# Patient Record
Sex: Male | Born: 1990 | Race: White | Hispanic: No | Marital: Single | Smoking: Current some day smoker
Health system: Southern US, Community
[De-identification: ages and names within clinical notes are randomized; demographics above are authoritative.]

---

## 2016-05-16 ENCOUNTER — Encounter (HOSPITAL_COMMUNITY): Payer: Self-pay | Admitting: Emergency Medicine

## 2016-05-16 ENCOUNTER — Emergency Department (HOSPITAL_COMMUNITY)
Admission: EM | Admit: 2016-05-16 | Discharge: 2016-05-16 | Disposition: A | Discharge status: Home / Self Care | Payer: Self-pay | Attending: Emergency Medicine | Admitting: Emergency Medicine

## 2016-05-16 ENCOUNTER — Emergency Department (HOSPITAL_COMMUNITY): Payer: Self-pay

## 2016-05-16 DIAGNOSIS — L259 Unspecified contact dermatitis, unspecified cause: Secondary | ICD-10-CM | POA: Insufficient documentation

## 2016-05-16 DIAGNOSIS — F17200 Nicotine dependence, unspecified, uncomplicated: Secondary | ICD-10-CM | POA: Insufficient documentation

## 2016-05-16 LAB — CBC WITH DIFFERENTIAL/PLATELET
BASOS ABS: 0 10*3/uL (ref 0.0–0.1)
BASOS PCT: 0 %
EOS ABS: 0 10*3/uL (ref 0.0–0.7)
Eosinophils Relative: 1 %
HEMATOCRIT: 42.9 % (ref 39.0–52.0)
HEMOGLOBIN: 14.7 g/dL (ref 13.0–17.0)
Lymphocytes Relative: 13 %
Lymphs Abs: 1 10*3/uL (ref 0.7–4.0)
MCH: 32.7 pg (ref 26.0–34.0)
MCHC: 34.3 g/dL (ref 30.0–36.0)
MCV: 95.3 fL (ref 78.0–100.0)
MONOS PCT: 7 %
Monocytes Absolute: 0.6 10*3/uL (ref 0.1–1.0)
NEUTROS ABS: 6.3 10*3/uL (ref 1.7–7.7)
NEUTROS PCT: 79 %
Platelets: 128 10*3/uL — ABNORMAL LOW (ref 150–400)
RBC: 4.5 MIL/uL (ref 4.22–5.81)
RDW: 13 % (ref 11.5–15.5)
WBC: 7.9 10*3/uL (ref 4.0–10.5)

## 2016-05-16 LAB — COMPREHENSIVE METABOLIC PANEL
ALBUMIN: 4.2 g/dL (ref 3.5–5.0)
ALK PHOS: 54 U/L (ref 38–126)
ALT: 41 U/L (ref 17–63)
AST: 55 U/L — AB (ref 15–41)
Anion gap: 11 (ref 5–15)
BILIRUBIN TOTAL: 1 mg/dL (ref 0.3–1.2)
BUN: 12 mg/dL (ref 6–20)
CALCIUM: 9.1 mg/dL (ref 8.9–10.3)
CO2: 21 mmol/L — AB (ref 22–32)
Chloride: 104 mmol/L (ref 101–111)
Creatinine, Ser: 1.02 mg/dL (ref 0.61–1.24)
GFR calc Af Amer: >60 mL/min (ref >60)
GFR calc non Af Amer: >60 mL/min (ref >60)
GLUCOSE: 64 mg/dL — AB (ref 65–99)
Potassium: 3.8 mmol/L (ref 3.5–5.1)
SODIUM: 136 mmol/L (ref 135–145)
TOTAL PROTEIN: 6.9 g/dL (ref 6.5–8.1)

## 2016-05-16 LAB — I-STAT TROPONIN, ED: Troponin i, poc: 0 ng/mL (ref 0.00–0.08)

## 2016-05-16 LAB — URINALYSIS, ROUTINE W REFLEX MICROSCOPIC
Bilirubin Urine: NEGATIVE
GLUCOSE, UA: NEGATIVE mg/dL
HGB URINE DIPSTICK: NEGATIVE
Ketones, ur: 15 mg/dL — AB
LEUKOCYTES UA: NEGATIVE
Nitrite: NEGATIVE
PROTEIN: NEGATIVE mg/dL
SPECIFIC GRAVITY, URINE: 1.011 (ref 1.005–1.030)
pH: 5 (ref 5.0–8.0)

## 2016-05-16 LAB — D-DIMER, QUANTITATIVE (NOT AT ARMC)

## 2016-05-16 MED ORDER — METHYLPREDNISOLONE SODIUM SUCC 125 MG IJ SOLR
125.0000 mg | Freq: Once | INTRAMUSCULAR | Status: AC
  Administered 2016-05-16: 125 mg via INTRAVENOUS
  Filled 2016-05-16: qty 2

## 2016-05-16 MED ORDER — PREDNISONE 10 MG PO TABS
40.0000 mg | ORAL_TABLET | Freq: Every day | ORAL | 0 refills | Status: AC
Start: 2016-05-16 — End: 2016-05-20

## 2016-05-16 MED ORDER — DIPHENHYDRAMINE HCL 50 MG/ML IJ SOLN
50.0000 mg | Freq: Once | INTRAMUSCULAR | Status: AC
  Administered 2016-05-16: 50 mg via INTRAVENOUS
  Filled 2016-05-16: qty 1

## 2016-05-16 MED ORDER — FAMOTIDINE IN NACL 20-0.9 MG/50ML-% IV SOLN
20.0000 mg | Freq: Once | INTRAVENOUS | Status: AC
  Administered 2016-05-16: 20 mg via INTRAVENOUS
  Filled 2016-05-16: qty 50

## 2016-05-16 MED ORDER — SODIUM CHLORIDE 0.9 % IV BOLUS (SEPSIS)
1000.0000 mL | Freq: Once | INTRAVENOUS | Status: AC
  Administered 2016-05-16: 1000 mL via INTRAVENOUS

## 2016-05-16 NOTE — ED Provider Notes (Signed)
[facility] DEPT Provider Note   CSN: [id number] Arrival date & time: 05/16/16  1547     History   Chief Complaint Chief Complaint  Patient presents with  . Allergic Reaction    HPI Andrew Mckinney is a 25 y.o. male.  HPI Patient is a 25 year old male no pertinent past medical history comes in today complaining of a rash.  Patient states that he has had symptoms like this previously has been going on for approximately 3 years however he states that this is the worst episode he has had.  Patient states he was at work when he suddenly started to feel that his heart was going to get out of his chest shortly thereafter patient erupted in a burning pruritic macular rash.  Patient states it is spread since that time and is now covering majority of his face shoulders becoming more  sparse inferiorly with fewer lesions on the abdomen.  Patient denies general or anal involvement.  Patient also states that he has the rash on his upper extremities as well sparing the palms of the hands.  Patient denies any rash in the lower extremities.  Patient denies fevers chills nausea vomiting.  Patient denies chest pain shortness of breath.  Physical exam: Rash described above.  Rashes diffusely erythematous no noted central clearing.  Rash covers patient's face neck shoulders chest back abdomen and bilateral upper extremities.  Patient clear to auscultation bilaterally.  Patient speaking in full sentences without shortness of breath.  Believe patient likely to be suffering from contact dermatitis.  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  Patient states that he has had exposure to Surveyor, quantity due to his job.  Will give steroids, Benadryl and we will additionally collect troponin and chest x-ray.  Pts workup largely WNL. Pt's rash improved w/ benadryl and steroids. Will discharge w/ prednisone.   Pt given appropriate f/u and return precautions. Pt voiced  understanding and is agreeable to discharge at this time.    History reviewed. No pertinent past medical history.  There are no active problems to display for this patient.   History reviewed. No pertinent surgical history.     Home Medications    Prior to Admission medications   Medication Sig Start Date End Date Taking? Authorizing Provider  diphenhydrAMINE (BENADRYL) 25 MG tablet Take 25 mg by mouth every 6 (six) hours as needed for sleep.   Yes Historical Provider, MD  ibuprofen (ADVIL,MOTRIN) 200 MG tablet Take 400 mg by mouth every 6 (six) hours as needed for mild pain or moderate pain.   Yes Historical Provider, MD  ketoconazole (NIZORAL) 2 % cream Apply 1 application topically daily as needed for irritation.   Yes Historical Provider, MD  predniSONE (DELTASONE) 10 MG tablet Take 4 tablets (40 mg total) by mouth daily. 05/16/16 05/20/16  [name], MD    Family History No family history on file.  Social History Social History  Substance Use Topics  . Smoking status: Current Some Day Smoker  . Smokeless tobacco: Never Used  . Alcohol use Not on file     Allergies   Lamisil [terbinafine hcl]   Review of Systems Review of Systems  Constitutional: Negative for chills, fatigue and fever.  Respiratory: Negative for chest tightness and shortness of breath.   Cardiovascular: Negative for leg swelling.  Gastrointestinal: Negative for nausea and vomiting.  Endocrine: Positive for polydipsia and polyuria.  Musculoskeletal: Negative for neck pain.  All  other systems reviewed and are negative.    Physical Exam Updated Vital Signs BP 154/96   Pulse 87   Temp 98.2 F (36.8 C) (Oral)   Resp 18   SpO2 100%   Physical Exam  Constitutional: He appears well-developed and well-nourished.  HENT:  Head: Normocephalic and atraumatic.  Eyes: Conjunctivae are normal.  Neck: Neck supple.  Cardiovascular: Normal rate and regular rhythm.   No murmur  heard. Pulmonary/Chest: Effort normal and breath sounds normal. No respiratory distress.  Abdominal: Soft. There is no tenderness.  Musculoskeletal: He exhibits no edema.  Neurological: He is alert.  Skin: Skin is warm and dry. Rash noted.  Psychiatric: He has a normal mood and affect.  Nursing note and vitals reviewed.    ED Treatments / Results  Labs (all labs ordered are listed, but only abnormal results are displayed) Labs Reviewed  CBC WITH DIFFERENTIAL/PLATELET - Abnormal; Notable for the following:       Result Value   Platelets 128 (*)    All other components within normal limits  COMPREHENSIVE METABOLIC PANEL - Abnormal; Notable for the following:    CO2 21 (*)    Glucose, Bld 64 (*)    AST 55 (*)    All other components within normal limits  URINALYSIS, ROUTINE W REFLEX MICROSCOPIC (NOT AT [facility]) - Abnormal; Notable for the following:    Ketones, ur 15 (*)    All other components within normal limits  D-DIMER, QUANTITATIVE (NOT AT [facility])  I-STAT TROPOININ, ED    EKG  EKG Interpretation  Date/Time:  Friday May 16 2016 16:11:52 EDT Ventricular Rate:  124 PR Interval:    QRS Duration: 77 QT Interval:  290 QTC Calculation: 417 R Axis:   86 Text Interpretation:  Sinus tachycardia Probable left atrial enlargement Anterior infarct, old No previous ECGs available Confirmed by YAO  MD, DAVID ([id number]) on 05/16/2016 4:14:13 PM       Radiology Dg Chest 2 View  Result Date: 05/16/2016 CLINICAL DATA:  Chest pain EXAM: CHEST  2 VIEW COMPARISON:  None. FINDINGS: The heart size and mediastinal contours are within normal limits. Both lungs are clear. The visualized skeletal structures are unremarkable. IMPRESSION: No active cardiopulmonary disease. Electronically Signed   By: Charlett NoseKevin  Dover M.D.   On: 05/16/2016 17:21    Procedures Procedures (including critical care time)  Medications Ordered in ED Medications  sodium chloride 0.9 % bolus 1,000 mL (0 mLs Intravenous  Stopped 05/16/16 1740)  methylPREDNISolone sodium succinate (SOLU-MEDROL) 125 mg/2 mL injection 125 mg (125 mg Intravenous Given 05/16/16 1648)  diphenhydrAMINE (BENADRYL) injection 50 mg (50 mg Intravenous Given 05/16/16 1648)  famotidine (PEPCID) IVPB 20 mg premix (0 mg Intravenous Stopped 05/16/16 1722)     Initial Impression / Assessment and Plan / ED Course  I have reviewed the triage vital signs and the nursing notes.  Pertinent labs & imaging results that were available during my care of the patient were reviewed by me and considered in my medical decision making (see chart for details).  Clinical Course    Patient is a 25 year old male no pertinent past medical history comes in today complaining of a rash.  Patient states that he has had symptoms like this previously has been going on for approximately 3 years however he states that this is the worst episode he has had.  Patient states he was at work when he suddenly started to feel that his heart was going to get out of his  chest shortly thereafter patient erupted in a burning pruritic macular rash.  Patient states it is spread since that time and is now covering majority of his face shoulders becoming more  sparse inferiorly with fewer lesions on the abdomen.  Patient denies general or anal involvement.  Patient also states that he has the rash on his upper extremities as well sparing the palms of the hands.  Patient denies any rash in the lower extremities.  Patient denies fevers chills nausea vomiting.  Patient denies chest pain shortness of breath.  Physical exam: Rash described above.  Rashes diffusely erythematous no noted central clearing.  Rash covers patient's face neck shoulders chest back abdomen and bilateral upper extremities.  Patient clear to auscultation bilaterally.  Patient speaking in full sentences without shortness of breath.  Believe patient likely to be suffering from atopic dermatitis.  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  Patient states that he has had exposure to Surveyor, quantity due to his job.  Will give steroids, Benadryl and we will additionally collect troponin and chest x-ray.   Final Clinical Impressions(s) / ED Diagnoses   Final diagnoses:  Contact dermatitis    New Prescriptions Discharge Medication List as of 05/16/2016  9:06 PM    START taking these medications   Details  predniSONE (DELTASONE) 10 MG tablet Take 4 tablets (40 mg total) by mouth daily., Starting Fri 05/16/2016, Until Tue 05/20/2016, Print         [name], MD 05/16/16 4098    Charlynne Pander, MD 05/17/16 1415

## 2016-05-16 NOTE — ED Notes (Signed)
Patient states he doesn't feel like his rash is any better.  Has not been itching like he was

## 2016-05-16 NOTE — ED Triage Notes (Addendum)
Bad rash started at 8 am, burning and spreading , states crampy and is hoarse  Just getting off lamicil feels faint, feels sob  And bp is up , pt states rash has never been this high on chest before or face, face is flushed

## 2016-05-16 NOTE — ED Notes (Signed)
Rash noted all over body. Pt states this has been ongoing for several months, currently being treated with medications and topical creams at home. Reports pain and itching increased today, describes as burning sensation all over body. Respirations unlabored at present.

## 2016-05-16 NOTE — ED Notes (Signed)
Kathleen ArgueRyan Drewes [phone] (Brother)

## 2016-05-16 NOTE — ED Notes (Signed)
Discharge instructions and prescription reviewed - voiced understanding.  

## 2017-07-23 ENCOUNTER — Emergency Department (HOSPITAL_COMMUNITY): Payer: Self-pay

## 2017-07-23 ENCOUNTER — Other Ambulatory Visit: Payer: Self-pay

## 2017-07-23 ENCOUNTER — Emergency Department (HOSPITAL_COMMUNITY)
Admission: EM | Admit: 2017-07-23 | Discharge: 2017-07-23 | Payer: Self-pay | Attending: Emergency Medicine | Admitting: Emergency Medicine

## 2017-07-23 DIAGNOSIS — Y92023 Bedroom in mobile home as the place of occurrence of the external cause: Secondary | ICD-10-CM | POA: Insufficient documentation

## 2017-07-23 DIAGNOSIS — T23391A Burn of third degree of multiple sites of right wrist and hand, initial encounter: Secondary | ICD-10-CM | POA: Insufficient documentation

## 2017-07-23 DIAGNOSIS — Y9389 Activity, other specified: Secondary | ICD-10-CM | POA: Insufficient documentation

## 2017-07-23 DIAGNOSIS — Y999 Unspecified external cause status: Secondary | ICD-10-CM | POA: Insufficient documentation

## 2017-07-23 DIAGNOSIS — J705 Respiratory conditions due to smoke inhalation: Secondary | ICD-10-CM

## 2017-07-23 DIAGNOSIS — T23292A Burn of second degree of multiple sites of left wrist and hand, initial encounter: Secondary | ICD-10-CM | POA: Insufficient documentation

## 2017-07-23 DIAGNOSIS — T59811A Toxic effect of smoke, accidental (unintentional), initial encounter: Secondary | ICD-10-CM | POA: Insufficient documentation

## 2017-07-23 DIAGNOSIS — Z23 Encounter for immunization: Secondary | ICD-10-CM | POA: Insufficient documentation

## 2017-07-23 DIAGNOSIS — S6990XA Unspecified injury of unspecified wrist, hand and finger(s), initial encounter: Secondary | ICD-10-CM | POA: Insufficient documentation

## 2017-07-23 DIAGNOSIS — X000XXA Exposure to flames in uncontrolled fire in building or structure, initial encounter: Secondary | ICD-10-CM | POA: Insufficient documentation

## 2017-07-23 DIAGNOSIS — T23361A Burn of third degree of back of right hand, initial encounter: Secondary | ICD-10-CM

## 2017-07-23 LAB — BPAM FFP
BLOOD PRODUCT EXPIRATION DATE: 201811222359
BLOOD PRODUCT EXPIRATION DATE: 201811222359
ISSUE DATE / TIME: 201811220325
ISSUE DATE / TIME: 201811220325
UNIT TYPE AND RH: 6200
UNIT TYPE AND RH: 6200

## 2017-07-23 LAB — TYPE AND SCREEN
ABO/RH(D): A POS
Antibody Screen: NEGATIVE
UNIT DIVISION: 0
Unit division: 0

## 2017-07-23 LAB — PREPARE FRESH FROZEN PLASMA
UNIT DIVISION: 0
UNIT DIVISION: 0

## 2017-07-23 LAB — I-STAT ARTERIAL BLOOD GAS, ED
ACID-BASE DEFICIT: 4 mmol/L — AB (ref 0.0–2.0)
BICARBONATE: 21.1 mmol/L (ref 20.0–28.0)
O2 Saturation: 100 %
PH ART: 7.342 — AB (ref 7.350–7.450)
TCO2: 22 mmol/L (ref 22–32)
pCO2 arterial: 38.7 mmHg (ref 32.0–48.0)
pO2, Arterial: 255 mmHg — ABNORMAL HIGH (ref 83.0–108.0)

## 2017-07-23 LAB — COOXEMETRY PANEL
Carboxyhemoglobin: 7.7 % (ref 0.5–1.5)
METHEMOGLOBIN: 1.2 % (ref 0.0–1.5)
O2 Saturation: 87.2 %
Total hemoglobin: 15.6 g/dL (ref 12.0–16.0)

## 2017-07-23 LAB — CBC
HEMATOCRIT: 45.1 % (ref 39.0–52.0)
Hemoglobin: 16.1 g/dL (ref 13.0–17.0)
MCH: 33.8 pg (ref 26.0–34.0)
MCHC: 35.7 g/dL (ref 30.0–36.0)
MCV: 94.7 fL (ref 78.0–100.0)
Platelets: 189 10*3/uL (ref 150–400)
RBC: 4.76 MIL/uL (ref 4.22–5.81)
RDW: 12.5 % (ref 11.5–15.5)
WBC: 7.9 10*3/uL (ref 4.0–10.5)

## 2017-07-23 LAB — BPAM RBC
BLOOD PRODUCT EXPIRATION DATE: 201812122359
Blood Product Expiration Date: 201812072359
ISSUE DATE / TIME: 201811220325
ISSUE DATE / TIME: 201811220325
UNIT TYPE AND RH: 9500
Unit Type and Rh: 9500

## 2017-07-23 LAB — I-STAT CHEM 8, ED
BUN: 10 mg/dL (ref 6–20)
CALCIUM ION: 0.97 mmol/L — AB (ref 1.15–1.40)
CREATININE: 1.3 mg/dL — AB (ref 0.61–1.24)
Chloride: 104 mmol/L (ref 101–111)
GLUCOSE: 121 mg/dL — AB (ref 65–99)
HCT: 45 % (ref 39.0–52.0)
HEMOGLOBIN: 15.3 g/dL (ref 13.0–17.0)
Potassium: 3.6 mmol/L (ref 3.5–5.1)
Sodium: 139 mmol/L (ref 135–145)
TCO2: 19 mmol/L — ABNORMAL LOW (ref 22–32)

## 2017-07-23 LAB — CDS SEROLOGY

## 2017-07-23 LAB — ETHANOL: Alcohol, Ethyl (B): 211 mg/dL — ABNORMAL HIGH (ref <10)

## 2017-07-23 LAB — COMPREHENSIVE METABOLIC PANEL
ALK PHOS: 62 U/L (ref 38–126)
ALT: 53 U/L (ref 17–63)
ANION GAP: 12 (ref 5–15)
AST: 42 U/L — ABNORMAL HIGH (ref 15–41)
Albumin: 3.9 g/dL (ref 3.5–5.0)
BUN: 9 mg/dL (ref 6–20)
CHLORIDE: 104 mmol/L (ref 101–111)
CO2: 20 mmol/L — ABNORMAL LOW (ref 22–32)
CREATININE: 1.01 mg/dL (ref 0.61–1.24)
Calcium: 8.3 mg/dL — ABNORMAL LOW (ref 8.9–10.3)
GLUCOSE: 116 mg/dL — AB (ref 65–99)
Potassium: 3.5 mmol/L (ref 3.5–5.1)
SODIUM: 136 mmol/L (ref 135–145)
TOTAL PROTEIN: 6.9 g/dL (ref 6.5–8.1)
Total Bilirubin: 0.6 mg/dL (ref 0.3–1.2)

## 2017-07-23 LAB — PROTIME-INR
INR: 0.87
Prothrombin Time: 11.7 seconds (ref 11.4–15.2)

## 2017-07-23 LAB — I-STAT CG4 LACTIC ACID, ED: LACTIC ACID, VENOUS: 3.37 mmol/L — AB (ref 0.5–1.9)

## 2017-07-23 LAB — ABO/RH: ABO/RH(D): A POS

## 2017-07-23 MED ORDER — PROPOFOL 1000 MG/100ML IV EMUL
INTRAVENOUS | Status: AC
  Administered 2017-07-23: 10 ug/kg/min via INTRAVENOUS
  Filled 2017-07-23: qty 100

## 2017-07-23 MED ORDER — FENTANYL CITRATE (PF) 100 MCG/2ML IJ SOLN
100.0000 ug | Freq: Once | INTRAMUSCULAR | Status: AC
  Administered 2017-07-23: 100 ug via INTRAVENOUS

## 2017-07-23 MED ORDER — FENTANYL CITRATE (PF) 100 MCG/2ML IJ SOLN
INTRAMUSCULAR | Status: AC
  Filled 2017-07-23: qty 2

## 2017-07-23 MED ORDER — LACTATED RINGERS IV BOLUS (SEPSIS)
2000.0000 mL | Freq: Once | INTRAVENOUS | Status: AC
  Administered 2017-07-23: 2000 mL via INTRAVENOUS

## 2017-07-23 MED ORDER — ETOMIDATE 2 MG/ML IV SOLN
INTRAVENOUS | Status: AC | PRN
  Administered 2017-07-23: 20 mg via INTRAVENOUS

## 2017-07-23 MED ORDER — HYDROMORPHONE HCL 1 MG/ML IJ SOLN
1.0000 mg | Freq: Once | INTRAMUSCULAR | Status: AC
  Administered 2017-07-23: 1 mg via INTRAVENOUS
  Filled 2017-07-23: qty 1

## 2017-07-23 MED ORDER — SUCCINYLCHOLINE CHLORIDE 20 MG/ML IJ SOLN
INTRAMUSCULAR | Status: AC | PRN
  Administered 2017-07-23: 100 mg via INTRAVENOUS

## 2017-07-23 MED ORDER — TETANUS-DIPHTH-ACELL PERTUSSIS 5-2.5-18.5 LF-MCG/0.5 IM SUSP
0.5000 mL | Freq: Once | INTRAMUSCULAR | Status: AC
  Administered 2017-07-23: 0.5 mL via INTRAMUSCULAR
  Filled 2017-07-23: qty 0.5

## 2017-07-23 MED ORDER — LACTATED RINGERS IV BOLUS (SEPSIS)
1000.0000 mL | Freq: Once | INTRAVENOUS | Status: AC
  Administered 2017-07-23: 1000 mL via INTRAVENOUS

## 2017-07-23 MED ORDER — SILVER SULFADIAZINE 1 % EX CREA
TOPICAL_CREAM | Freq: Every day | CUTANEOUS | Status: DC
  Administered 2017-07-23: 05:00:00 via TOPICAL
  Filled 2017-07-23: qty 85

## 2017-07-23 MED ORDER — PROPOFOL 1000 MG/100ML IV EMUL
5.0000 ug/kg/min | Freq: Once | INTRAVENOUS | Status: AC
  Administered 2017-07-23: 10 ug/kg/min via INTRAVENOUS

## 2017-07-23 MED ORDER — HYDROMORPHONE HCL 1 MG/ML IJ SOLN
INTRAMUSCULAR | Status: AC
  Filled 2017-07-23: qty 1

## 2017-07-23 NOTE — ED Notes (Signed)
[facility]

## 2017-07-23 NOTE — ED Notes (Signed)
All belongings sent with patient, shirt jacket and pants. Pt did not come to ER with any valuables or any shoes, NO cell phone, No wallet, No keys.
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
## 2017-07-23 NOTE — ED Notes (Signed)
[facility]

## 2017-07-23 NOTE — ED Notes (Signed)
I placed the patient back on a non rebreather mask.
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
## 2017-07-23 NOTE — ED Notes (Signed)
RT placed the patient on a nasal cannula.

## 2017-07-23 NOTE — ED Notes (Signed)
[facility] arrived [facility] however they feel the patient risk for emergency intubation in route is great. Pt spit up black tinged mucous, thick and moderate amount. MD informed. Preparing for intubation.
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
## 2017-07-23 NOTE — ED Notes (Signed)
[facility] called for transport. 

## 2017-07-28 MED FILL — Midazolam HCl Inj 5 MG/5ML (Base Equivalent): INTRAMUSCULAR | Qty: 5 | Status: AC

## 2017-07-28 MED FILL — Fentanyl Citrate Preservative Free (PF) Inj 100 MCG/2ML: INTRAMUSCULAR | Qty: 2 | Status: AC
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
